# Patient Record
Sex: Male | Born: 1990 | Race: Black or African American | Hispanic: No | Marital: Single
Health system: Southern US, Community
[De-identification: ages and names within clinical notes are randomized; demographics above are authoritative.]

---

## 2018-12-09 ENCOUNTER — Emergency Department
Admission: EM | Admit: 2018-12-09 | Discharge: 2018-12-09 | Disposition: A | Discharge status: Home / Self Care | Payer: Managed Care, Other (non HMO) | Attending: Emergency Medicine | Admitting: Emergency Medicine

## 2018-12-09 ENCOUNTER — Emergency Department: Payer: Managed Care, Other (non HMO)

## 2018-12-09 ENCOUNTER — Other Ambulatory Visit: Payer: Self-pay

## 2018-12-09 ENCOUNTER — Encounter: Payer: Self-pay | Admitting: Emergency Medicine

## 2018-12-09 DIAGNOSIS — S0990XA Unspecified injury of head, initial encounter: Secondary | ICD-10-CM | POA: Diagnosis present

## 2018-12-09 DIAGNOSIS — M791 Myalgia, unspecified site: Secondary | ICD-10-CM | POA: Diagnosis not present

## 2018-12-09 DIAGNOSIS — M542 Cervicalgia: Secondary | ICD-10-CM | POA: Diagnosis not present

## 2018-12-09 DIAGNOSIS — S060X0A Concussion without loss of consciousness, initial encounter: Secondary | ICD-10-CM | POA: Diagnosis not present

## 2018-12-09 DIAGNOSIS — M546 Pain in thoracic spine: Secondary | ICD-10-CM | POA: Diagnosis not present

## 2018-12-09 DIAGNOSIS — Y999 Unspecified external cause status: Secondary | ICD-10-CM | POA: Diagnosis not present

## 2018-12-09 DIAGNOSIS — Y92410 Unspecified street and highway as the place of occurrence of the external cause: Secondary | ICD-10-CM | POA: Diagnosis not present

## 2018-12-09 DIAGNOSIS — M7918 Myalgia, other site: Secondary | ICD-10-CM

## 2018-12-09 DIAGNOSIS — Y939 Activity, unspecified: Secondary | ICD-10-CM | POA: Diagnosis not present

## 2018-12-09 MED ORDER — CYCLOBENZAPRINE HCL 10 MG PO TABS: 10.0000 mg | ORAL_TABLET | Freq: Three times a day (TID) | ORAL | 0 refills | Status: AC | PRN

## 2018-12-09 MED ORDER — MELOXICAM 15 MG PO TABS: 15.0000 mg | ORAL_TABLET | Freq: Every day | ORAL | 2 refills | Status: AC
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
## 2018-12-09 NOTE — Discharge Instructions (Signed)
Follow-up with orthopedics if you are not better in 5 to 7 days.  Take medications as prescribed.  Follow the concussion instructions.  Please remain in a quiet environment for the next 2 days to help your brain heal.  Return emergency department if you are worsening.

## 2018-12-09 NOTE — ED Notes (Signed)
Pt returned ambulatory from xray
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
## 2018-12-09 NOTE — ED Notes (Signed)
Pt ambulatory to CT
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
